# Patient Record
Sex: Female | Born: 2000 | Race: White | Hispanic: No | Marital: Single
Health system: Midwestern US, Community
[De-identification: ages and names within clinical notes are randomized; demographics above are authoritative.]

## PROBLEM LIST (undated history)

## (undated) DIAGNOSIS — T7840XA Allergy, unspecified, initial encounter: Secondary | ICD-10-CM

## (undated) DIAGNOSIS — E039 Hypothyroidism, unspecified: Secondary | ICD-10-CM

## (undated) DIAGNOSIS — F429 Obsessive-compulsive disorder, unspecified: Secondary | ICD-10-CM

## (undated) DIAGNOSIS — J45909 Unspecified asthma, uncomplicated: Secondary | ICD-10-CM

## (undated) DIAGNOSIS — T8859XA Other complications of anesthesia, initial encounter: Secondary | ICD-10-CM

## (undated) DIAGNOSIS — S83241A Other tear of medial meniscus, current injury, right knee, initial encounter: Secondary | ICD-10-CM

## (undated) DIAGNOSIS — S83529A Sprain of posterior cruciate ligament of unspecified knee, initial encounter: Secondary | ICD-10-CM

## (undated) DIAGNOSIS — K589 Irritable bowel syndrome without diarrhea: Secondary | ICD-10-CM

## (undated) DIAGNOSIS — T4145XA Adverse effect of unspecified anesthetic, initial encounter: Secondary | ICD-10-CM

## (undated) HISTORY — DX: Allergy, unspecified, initial encounter: T78.40XA

## (undated) HISTORY — DX: Unspecified asthma, uncomplicated: J45.909

## (undated) HISTORY — PX: ANTERIOR CRUCIATE LIGAMENT REPAIR: SHX115

## (undated) HISTORY — DX: Obsessive-compulsive disorder, unspecified: F42.9

---
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
## 2018-01-04 ENCOUNTER — Other Ambulatory Visit: Payer: Self-pay | Admitting: Family Medicine

## 2018-01-06 ENCOUNTER — Other Ambulatory Visit: Payer: Self-pay | Admitting: Family Medicine

## 2018-01-06 ENCOUNTER — Other Ambulatory Visit: Payer: Self-pay

## 2018-01-06 MED ORDER — NORELGESTROMIN-ETH ESTRADIOL 150-35 MCG/24HR TD PTWK: 1.0000 | MEDICATED_PATCH | TRANSDERMAL | 0 refills | Status: DC

## 2018-01-06 NOTE — Telephone Encounter (Signed)
Sent in [facility] just now/thx dmf

## 2018-06-06 ENCOUNTER — Other Ambulatory Visit: Payer: Self-pay

## 2018-06-06 ENCOUNTER — Encounter: Payer: Self-pay | Admitting: Family Medicine

## 2018-06-06 DIAGNOSIS — Z760 Encounter for issue of repeat prescription: Secondary | ICD-10-CM

## 2018-06-06 DIAGNOSIS — J45909 Unspecified asthma, uncomplicated: Secondary | ICD-10-CM

## 2018-06-06 MED ORDER — OMEPRAZOLE 20 MG PO CPDR: 20.0000 mg | DELAYED_RELEASE_CAPSULE | Freq: Every day | ORAL | 2 refills | Status: DC

## 2018-06-06 MED ORDER — ADVAIR HFA 115-21 MCG/ACT IN AERO: INHALATION_SPRAY | RESPIRATORY_TRACT | 0 refills | Status: DC

## 2018-08-22 ENCOUNTER — Telehealth: Payer: Self-pay | Admitting: Family Medicine

## 2018-08-22 ENCOUNTER — Encounter (INDEPENDENT_AMBULATORY_CARE_PROVIDER_SITE_OTHER): Payer: PRIVATE HEALTH INSURANCE | Admitting: Family Medicine

## 2018-08-22 ENCOUNTER — Encounter: Payer: Self-pay | Admitting: Family Medicine

## 2018-08-22 DIAGNOSIS — J45909 Unspecified asthma, uncomplicated: Secondary | ICD-10-CM | POA: Diagnosis not present

## 2018-08-22 MED ORDER — PROAIR HFA 108 (90 BASE) MCG/ACT IN AERS: INHALATION_SPRAY | RESPIRATORY_TRACT | 3 refills | Status: DC

## 2018-08-22 MED ORDER — LEVALBUTEROL HCL 1.25 MG/3ML IN NEBU: 1.2500 mg | INHALATION_SOLUTION | RESPIRATORY_TRACT | 99 refills | Status: DC | PRN

## 2018-08-22 MED ORDER — AZITHROMYCIN 250 MG PO TABS: ORAL_TABLET | ORAL | 0 refills | Status: DC

## 2018-08-22 NOTE — Telephone Encounter (Signed)
[facility] VISIT   Patient agreed to [facility] visit and is aware that copayment and coinsurance may apply. Patient was treated using telemedicine according to accepted telemedicine protocols.  Subjective:   Patient complains of URI symptoms.  Pt sent my chart message sayting she is having sinus problems, including a headache and sinus pressure.  She also has a bad cough and lungs are hurting, but not running a fever. She  "can't go to an urgent care due to COVID-19" and was wondering if I would be able to provide a new prescription for levabuterol for my breathing treatments and a zpack. She is doing breathing treatments every four hours but the medicine I she has have have expired. She am also taking mucinex DM and Advil. She is hoping that she'll get better without having to take the zpack, but since the weekend is coming up, I would appreciate having the prescription just in case. Thank you!  Jamie Marsh, Jamie Marsh  to Me        08/22/18 11:22 AM  Yes, please route my message to my PCP.    Social History   Tobacco Use  . Smoking status: Never Smoker  . Smokeless tobacco: Never Used  . Tobacco comment: no one smokes in the home with pt  Substance Use Topics  . Alcohol use: No    Alcohol/week: 0.0 standard drinks    Current Outpatient Medications:  .  ADVAIR HFA 115-21 MCG/ACT inhaler, USE 2 INHALATIONS BY MOUTH TWICE A DAY, Disp: 12 Inhaler, Rfl: 0 .  Cetirizine HCl (ZYRTEC PO), Take by mouth at bedtime. , Disp: , Rfl:  .  EPINEPHrine 0.3 mg/0.3 mL IJ SOAJ injection, , Disp: , Rfl:  .  ibuprofen (ADVIL,MOTRIN) 600 MG tablet, Take 1 tab PO Q6h x 1-2 days then Q6h prn pain, Disp: 30 tablet, Rfl: 0 .  levalbuterol (XOPENEX) 1.25 MG/3ML nebulizer solution, Take 1.25 mg by nebulization every 4 (four) hours as needed for wheezing., Disp: 3 mL, Rfl: PRN .  levothyroxine (SYNTHROID, LEVOTHROID) 88 MCG tablet, Take 1 tablet (88 mcg total) by mouth daily., Disp: 90 tablet, Rfl: 2 .  levothyroxine  (SYNTHROID, LEVOTHROID) 88 MCG tablet, TAKE 1 TABLET BY MOUTH EVERY DAY, Disp: 30 tablet, Rfl: 0 .  montelukast (SINGULAIR) 10 MG tablet, Take 1 tablet (10 mg total) by mouth at bedtime., Disp: 90 tablet, Rfl: 2 .  montelukast (SINGULAIR) 10 MG tablet, TAKE 1 TABLET BY MOUTH EVERYDAY AT BEDTIME, Disp: 30 tablet, Rfl: 0 .  norelgestromin-ethinyl estradiol (ORTHO EVRA) 150-35 MCG/24HR transdermal patch, Place 1 patch onto the skin once a week., Disp: 3 patch, Rfl: 0 .  omeprazole (PRILOSEC) 20 MG capsule, Take 1 capsule (20 mg total) by mouth daily., Disp: 90 capsule, Rfl: 2 .  PROAIR HFA 108 (90 Base) MCG/ACT inhaler, INHALE 2 PUFFS BY MOUTH EVERY 8 HOURS FOR WHEEZING, Disp: 1 Inhaler, Rfl: 3 .  SALINE NASAL MIST NA, Place into the nose daily. , Disp: , Rfl:  .  sertraline (ZOLOFT) 100 MG tablet, Take 1.5 tablets (150 mg total) by mouth daily., Disp: 135 tablet, Rfl: 2 .  sertraline (ZOLOFT) 100 MG tablet, TAKE 1.5 TABLETS (150 MG TOTAL) BY MOUTH DAILY., Disp: 45 tablet, Rfl: 0  Allergies  Allergen Reactions  . Augmentin [Amoxicillin-Pot Clavulanate] Nausea And Vomiting  . Cefdinir Diarrhea and Nausea And Vomiting    Assessment and Plan:   Diagnosis: 12. Please see myChart communication and orders below.   No orders of the defined types  were placed in this encounter.  No orders of the defined types were placed in this encounter.   [name], MD 08/22/2018  A total of 12 minutes were spent by me to personally review the patient-generated inquiry, review patient records and data pertinent to assessment of the patient's problem, develop a management plan including generation of prescriptions and/or orders, and on subsequent communication with the patient through secure the MyChart portal service.   There is no separately reported E/M service related to this service in the past 7 days nor does the patient have an upcoming soonest available appointment for this issue. This work was completed  in less than 7 days.   The patient consented to this service today (see patient agreement prior to ongoing communication). Patient counseled regarding the need for in-person exam for certain conditions and was advised to call the office if any changing or worsening symptoms occur.   The codes to be used for the E/M service are: []   99421 for 5-10 minutes of time spent on the inquiry. [x]   99422 for 11-20 minutes. []   [id number] for 21+ minutes.

## 2018-09-03 ENCOUNTER — Telehealth: Payer: Self-pay | Admitting: Family Medicine

## 2018-09-03 ENCOUNTER — Other Ambulatory Visit: Payer: Self-pay | Admitting: Family Medicine

## 2018-09-03 DIAGNOSIS — J45909 Unspecified asthma, uncomplicated: Secondary | ICD-10-CM

## 2018-09-03 MED ORDER — ADVAIR HFA 115-21 MCG/ACT IN AERO: INHALATION_SPRAY | RESPIRATORY_TRACT | 0 refills | Status: DC

## 2018-09-03 NOTE — Telephone Encounter (Signed)
Copied from CRM [id number]. Topic: Quick Communication - Rx Refill/Question >> Sep 03, 2018  1:32 PM Saverio Danker J wrote: Medication: ADVAIR [facility] 283-66 MCG/ACT inhaler  (Agent: If no, request that the patient contact the pharmacy for the refill.) yes (Agent: If yes, when and what did the pharmacy advise?)  Preferred Pharmacy (with phone number or street name): cb is [phone] [facility] [place] [facility] called - they are doing a transfer from [facility] and want to make sure rx I written properly.  It says qty 12 inhalers with 0 refills. Is this right?  Agent: Please be advised that RX refills may take up to 3 business days. We ask that you follow-up with your pharmacy.

## 2018-09-04 MED ORDER — ADVAIR HFA 115-21 MCG/ACT IN AERO: INHALATION_SPRAY | RESPIRATORY_TRACT | 11 refills | Status: DC

## 2018-09-04 NOTE — Telephone Encounter (Signed)
I think just one inhaler!  So strange!!

## 2018-09-04 NOTE — Addendum Note (Signed)
Addended by: Lerry Liner on: 09/04/2018 03:39 PM   Modules accepted: Orders

## 2018-09-04 NOTE — Telephone Encounter (Signed)
TA-Did you mean to write for 12 inhalers or  Like 1 with 12 refills? Plz advise/thx dmf

## 2018-10-15 ENCOUNTER — Encounter: Payer: Self-pay | Admitting: Family Medicine

## 2018-10-16 ENCOUNTER — Other Ambulatory Visit: Payer: Self-pay

## 2018-10-16 DIAGNOSIS — Z760 Encounter for issue of repeat prescription: Secondary | ICD-10-CM

## 2018-10-16 DIAGNOSIS — J45909 Unspecified asthma, uncomplicated: Secondary | ICD-10-CM

## 2018-10-16 MED ORDER — PROAIR HFA 108 (90 BASE) MCG/ACT IN AERS: INHALATION_SPRAY | RESPIRATORY_TRACT | 3 refills | Status: DC

## 2018-10-16 MED ORDER — NORELGESTROMIN-ETH ESTRADIOL 150-35 MCG/24HR TD PTWK: 1.0000 | MEDICATED_PATCH | TRANSDERMAL | 0 refills | Status: DC

## 2018-10-16 MED ORDER — MONTELUKAST SODIUM 10 MG PO TABS: 10.0000 mg | ORAL_TABLET | Freq: Every day | ORAL | 2 refills | Status: DC

## 2018-10-16 MED ORDER — LEVALBUTEROL HCL 1.25 MG/3ML IN NEBU: 1.2500 mg | INHALATION_SOLUTION | RESPIRATORY_TRACT | 99 refills | Status: DC | PRN

## 2018-10-16 MED ORDER — OMEPRAZOLE 20 MG PO CPDR: 20.0000 mg | DELAYED_RELEASE_CAPSULE | Freq: Every day | ORAL | 2 refills | Status: DC

## 2018-10-16 MED ORDER — ADVAIR HFA 115-21 MCG/ACT IN AERO: INHALATION_SPRAY | RESPIRATORY_TRACT | 3 refills | Status: DC

## 2018-10-16 MED ORDER — LEVOTHYROXINE SODIUM 88 MCG PO TABS: 88.0000 ug | ORAL_TABLET | Freq: Every day | ORAL | 0 refills | Status: DC

## 2018-10-16 MED ORDER — SERTRALINE HCL 100 MG PO TABS: 150.0000 mg | ORAL_TABLET | Freq: Every day | ORAL | 2 refills | Status: DC

## 2018-10-16 NOTE — Progress Notes (Signed)
Per TA ok to send in Rx's pt will need 90d each for travel/thx dmf

## 2018-10-21 ENCOUNTER — Other Ambulatory Visit: Payer: Self-pay | Admitting: Family Medicine

## 2018-10-21 NOTE — Progress Notes (Signed)
TA-Plz see note for pt and advise if changes need to be made/thx dmf

## 2018-10-22 ENCOUNTER — Telehealth: Payer: Self-pay | Admitting: Family Medicine

## 2018-10-22 DIAGNOSIS — Z760 Encounter for issue of repeat prescription: Secondary | ICD-10-CM

## 2018-10-22 DIAGNOSIS — J45909 Unspecified asthma, uncomplicated: Secondary | ICD-10-CM

## 2018-10-22 MED ORDER — LEVOTHYROXINE SODIUM 88 MCG PO TABS: 88.0000 ug | ORAL_TABLET | Freq: Every day | ORAL | 0 refills | Status: DC

## 2018-10-22 MED ORDER — OMEPRAZOLE 20 MG PO CPDR: 20.0000 mg | DELAYED_RELEASE_CAPSULE | Freq: Every day | ORAL | 2 refills | Status: DC

## 2018-10-22 MED ORDER — MONTELUKAST SODIUM 10 MG PO TABS: 10.0000 mg | ORAL_TABLET | Freq: Every day | ORAL | 2 refills | Status: DC

## 2018-10-22 MED ORDER — PROAIR HFA 108 (90 BASE) MCG/ACT IN AERS: INHALATION_SPRAY | RESPIRATORY_TRACT | 3 refills | Status: DC

## 2018-10-22 MED ORDER — ADVAIR HFA 115-21 MCG/ACT IN AERO: INHALATION_SPRAY | RESPIRATORY_TRACT | 3 refills | Status: DC

## 2018-10-22 MED ORDER — SERTRALINE HCL 100 MG PO TABS: 150.0000 mg | ORAL_TABLET | Freq: Every day | ORAL | 2 refills | Status: DC

## 2018-10-22 MED ORDER — NORELGESTROMIN-ETH ESTRADIOL 150-35 MCG/24HR TD PTWK: 1.0000 | MEDICATED_PATCH | TRANSDERMAL | 0 refills | Status: DC

## 2018-10-22 MED ORDER — LEVALBUTEROL HCL 1.25 MG/3ML IN NEBU: 1.2500 mg | INHALATION_SOLUTION | RESPIRATORY_TRACT | 99 refills | Status: DC | PRN

## 2018-10-22 NOTE — Telephone Encounter (Signed)
Rx's resent.  

## 2018-10-22 NOTE — Telephone Encounter (Signed)
Copied from CRM [id number]. Topic: Quick Communication - Rx Refill/Question >> Oct 22, 2018  1:49 PM Maia Petties wrote: Medication: pt states [facility] will not call for transfer & [facility] will not transfer RX. Pt is going to [place] for the summer and needs 3 month supply. Pt leaving on Tuesday 10/28/2018 in the morning. Pt needing resent please.              ADVAIR HFA 115-21 MCG/ACT inhaler    levalbuterol (XOPENEX) 1.25 MG/3ML nebulizer solution   levothyroxine (SYNTHROID) 88 MCG tablet   montelukast (SINGULAIR) 10 MG tablet   norelgestromin-ethinyl estradiol (ORTHO EVRA) 150-35 MCG/24HR transdermal patch   omeprazole (PRILOSEC) 20 MG capsule   PROAIR HFA 108 (90 Base) MCG/ACT inhaler   sertraline (ZOLOFT) 100 MG tablet   Has the patient contacted their pharmacy? Yes both refuse to transfer Preferred Pharmacy (with phone number or street name): [facility] [place], [place] - [address] [phone] (Phone) [phone] (Fax)

## 2018-10-22 NOTE — Telephone Encounter (Signed)
Jamie Marsh called to f/u on the attached form required by [employer] for her to bring her emotional support animal (dog) with her. Pt needs to submit this ASAP. Please see attachment.

## 2019-01-15 ENCOUNTER — Telehealth: Payer: Self-pay

## 2019-01-15 NOTE — Telephone Encounter (Signed)
Copied from [place] [id number]. Topic: General - Other >> Jan 15, 2019  3:25 PM Celene Kras A wrote: Reason for CRM: Pt is in [place] and is requesting to speak with PCP regarding medications. Please advise.

## 2019-01-16 ENCOUNTER — Other Ambulatory Visit: Payer: Self-pay

## 2019-01-16 DIAGNOSIS — J45909 Unspecified asthma, uncomplicated: Secondary | ICD-10-CM

## 2019-01-16 DIAGNOSIS — Z760 Encounter for issue of repeat prescription: Secondary | ICD-10-CM

## 2019-01-16 MED ORDER — SERTRALINE HCL 100 MG PO TABS: 150.0000 mg | ORAL_TABLET | Freq: Every day | ORAL | 2 refills | Status: DC

## 2019-01-16 MED ORDER — LEVALBUTEROL HCL 1.25 MG/3ML IN NEBU: 1.2500 mg | INHALATION_SOLUTION | RESPIRATORY_TRACT | 99 refills | Status: DC | PRN

## 2019-01-16 MED ORDER — PROAIR HFA 108 (90 BASE) MCG/ACT IN AERS: INHALATION_SPRAY | RESPIRATORY_TRACT | 2 refills | Status: DC

## 2019-01-16 MED ORDER — NORELGESTROMIN-ETH ESTRADIOL 150-35 MCG/24HR TD PTWK: 1.0000 | MEDICATED_PATCH | TRANSDERMAL | 2 refills | Status: DC

## 2019-01-16 MED ORDER — MONTELUKAST SODIUM 10 MG PO TABS: 10.0000 mg | ORAL_TABLET | Freq: Every day | ORAL | 2 refills | Status: DC

## 2019-01-16 MED ORDER — OMEPRAZOLE 20 MG PO CPDR: 20.0000 mg | DELAYED_RELEASE_CAPSULE | Freq: Every day | ORAL | 2 refills | Status: DC

## 2019-01-16 MED ORDER — LEVOTHYROXINE SODIUM 88 MCG PO TABS: 88.0000 ug | ORAL_TABLET | Freq: Every day | ORAL | 2 refills | Status: DC

## 2019-01-16 NOTE — Telephone Encounter (Signed)
I sent pt a message via MyChart/I offered V V and if she wanted to respond with any questions she has that we would try to help her/thx dmf

## 2019-05-31 NOTE — Assessment & Plan Note (Addendum)
History: Current symptoms: Fatigue. Patient denies other symptoms of hypo or hypothyroidism. Lab Results  Component Value Date   TSH 3.74 04/17/2017   TSH 7.16 (H) 12/20/2016   TSH 8.16 (H) 11/30/2016   Lab Results  Component Value Date   FREET4 0.81 04/17/2017   FREET4 0.89 12/20/2016   FREET4 0.86 07/05/2016    Assessment/Plan: Continue synthroid 88 mcg daily. Recheck thyroid labs today. Instructed not to take MVM or iron within 4 hours of taking thyroid medications.  Common Reasons for Abnormal TSH Levels on a Previously Stable Dosage of Thyroid Hormone . Patient nonadherent to thyroid hormone regimen (missing doses) . Decreased absorption of thyroid hormone . Patient is now taking thyroid hormone with food . Patient takes thyroid hormone within four hours of calcium, iron, soy products, or aluminum-containing antacids . Patient is prescribed medication that decreases absorption of thyroid hormone, such as cholestyramine (Questran), colestipol (Colestid), orlistat (Xenical), or sucralfate (Carafate) . Patient is now pregnant or recently started or stopped estrogen-containing oral contraceptive or hormone therapy . Generic substitution for brand name or vice versa, or substitution of one generic formulation for another . Patient started on sertraline (Zoloft), another selective serotonin reuptake inhibitor, or a tricyclic antidepressant . Patient started on carbamazepine (Tegretol) or phenytoin (Dilantin)

## 2019-05-31 NOTE — Assessment & Plan Note (Addendum)
With anxiety. Assessment: Current symptoms:None, No current suicidal and homicidal ideation. Side effects from treatment: none.  Plan: 1. Medications: Zoloft. Discussed potential risks, expected benefits, possible side effects of the medicine. We also discussed how to take it correctly and dosing instructions.  2. Labs: see orders. 3. Counseling  4. If she has any significant side effects to the medicine, she is to stop it and call for advice. Instructed patient to contact office or on-call physician promptly should condition worsen or any new symptoms appear.

## 2019-05-31 NOTE — Progress Notes (Signed)
Virtual Visit via Video   Due to the COVID-19 pandemic, this visit was completed with telemedicine (audio/video) technology to reduce patient and provider exposure as well as to preserve personal protective equipment.   I connected with Jamie Marsh by a video enabled telemedicine application and verified that I am speaking with the correct person using two identifiers. Location patient: Home Location provider: [facility], Office Persons participating in the virtual visit: Alphonsa Overall, MD   I discussed the limitations of evaluation and management by telemedicine and the availability of in person appointments. The patient expressed understanding and agreed to proceed.  Care Team   Patient Care Team: Dianne Dun, MD as PCP - General (Family Medicine)  Subjective:   HPI:  CPX and follow up of chronic medical conditions.  Doing well- was in [place] with her boyfriend all friend.  Using XULANE patch for contraception.   Depression screen [facility] 2/9 06/01/2019 04/17/2017  Decreased Interest 0 0  Down, Depressed, Hopeless 0 0  PHQ - 2 Score 0 0    Review of Systems  Constitutional: Positive for malaise/fatigue. Negative for fever.  HENT: Negative for congestion and hearing loss.   Eyes: Negative for blurred vision, discharge and redness.  Respiratory: Negative for cough and shortness of breath.   Cardiovascular: Negative for chest pain, palpitations and leg swelling.  Gastrointestinal: Negative for abdominal pain and heartburn.  Genitourinary: Negative for dysuria.  Musculoskeletal: Negative for falls.  Skin: Negative for rash.  Neurological: Negative for loss of consciousness and headaches.  Endo/Heme/Allergies: Does not bruise/bleed easily.  Psychiatric/Behavioral: Negative.  Negative for depression, hallucinations, memory loss, substance abuse and suicidal ideas. The patient is not nervous/anxious and does not have insomnia.   All other systems reviewed and are  negative.    Patient Active Problem List   Diagnosis Date Noted  . Well woman exam without gynecological exam 12/10/2017  . Anterolisthesis, grade 1, L5-S1 01/23/2016  . IBS (irritable bowel syndrome) 07/05/2014  . Dysmenorrhea 03/16/2013  . OCD (obsessive compulsive disorder)   . Allergy   . Asthma   . Hypothyroidism 11/06/2010    Social History   Tobacco Use  . Smoking status: Never Smoker  . Smokeless tobacco: Never Used  . Tobacco comment: no one smokes in the home with pt  Substance Use Topics  . Alcohol use: No    Alcohol/week: 0.0 standard drinks    Current Outpatient Medications:  .  ADVAIR HFA 115-21 MCG/ACT inhaler, USE 2 INHALATIONS BY MOUTH TWICE A DAY, Disp: 3 Inhaler, Rfl: 3 .  Cetirizine HCl (ZYRTEC PO), Take by mouth at bedtime. , Disp: , Rfl:  .  levalbuterol (XOPENEX) 1.25 MG/3ML nebulizer solution, Take 1.25 mg by nebulization every 4 (four) hours as needed for wheezing., Disp: 3 mL, Rfl: PRN .  levothyroxine (SYNTHROID) 88 MCG tablet, Take 1 tablet (88 mcg total) by mouth daily., Disp: 90 tablet, Rfl: 2 .  montelukast (SINGULAIR) 10 MG tablet, Take 1 tablet (10 mg total) by mouth at bedtime., Disp: 90 tablet, Rfl: 2 .  omeprazole (PRILOSEC) 20 MG capsule, Take 1 capsule (20 mg total) by mouth daily., Disp: 90 capsule, Rfl: 2 .  PROAIR HFA 108 (90 Base) MCG/ACT inhaler, INHALE 2 PUFFS BY MOUTH EVERY 8 HOURS FOR WHEEZING, Disp: 18 g, Rfl: 2 .  SALINE NASAL MIST NA, Place into the nose daily. , Disp: , Rfl:  .  sertraline (ZOLOFT) 100 MG tablet, Take 1.5 tablets (150 mg  total) by mouth daily., Disp: 135 tablet, Rfl: 2 .  XULANE 150-35 MCG/24HR transdermal patch, APPLY 1 PATCH TOPICALLY ONCE A WEEK, Disp: 3 patch, Rfl: 0  Allergies  Allergen Reactions  . Augmentin [Amoxicillin-Pot Clavulanate] Nausea And Vomiting  . Cefdinir Diarrhea and Nausea And Vomiting    Objective:  Ht 5\' 5"  (1.651 m)   LMP 05/19/2019   BMI 23.53 kg/m   VITALS: Per patient if  applicable, see vitals. GENERAL: Alert, appears well and in no acute distress. HEENT: Atraumatic, conjunctiva clear, no obvious abnormalities on inspection of external nose and ears. NECK: Normal movements of the head and neck. CARDIOPULMONARY: No increased WOB. Speaking in clear sentences. I:E ratio WNL.  MS: Moves all visible extremities without noticeable abnormality. PSYCH: Pleasant and cooperative, well-groomed. Speech normal rate and rhythm. Affect is appropriate. Insight and judgement are appropriate. Attention is focused, linear, and appropriate.  NEURO: CN grossly intact. Oriented as arrived to appointment on time with no prompting. Moves both UE equally.  SKIN: No obvious lesions, wounds, erythema, or cyanosis noted on face or hands.  Depression screen [facility] 2/9 06/01/2019 04/17/2017  Decreased Interest 0 0  Down, Depressed, Hopeless 0 0  PHQ - 2 Score 0 0     . COVID-19 Education: The signs and symptoms of COVID-19 were discussed with the patient and how to seek care for testing if needed. The importance of social distancing was discussed today. . Reviewed expectations re: course of current medical issues. . Discussed self-management of symptoms. . Outlined signs and symptoms indicating need for more acute intervention. . Patient verbalized understanding and all questions were answered. 04/19/2017 Health Maintenance issues including appropriate healthy diet, exercise, and smoking avoidance were discussed with patient. . See orders for this visit as documented in the electronic medical record.  Marland Kitchen, MD    Lab Results  Component Value Date   WBC 7.5 07/05/2016   HGB 12.5 07/05/2016   HCT 36.9 07/05/2016   PLT 283.0 07/05/2016   GLUCOSE 79 04/29/2016   NA 143 04/29/2016   K 3.4 (L) 04/29/2016   CL 103 04/29/2016   CREATININE 0.60 04/29/2016   BUN 8 04/29/2016   TSH 3.74 04/17/2017    Lab Results  Component Value Date   TSH 3.74 04/17/2017   Lab Results  Component  Value Date   WBC 7.5 07/05/2016   HGB 12.5 07/05/2016   HCT 36.9 07/05/2016   MCV 86.1 07/05/2016   PLT 283.0 07/05/2016   Lab Results  Component Value Date   NA 143 04/29/2016   K 3.4 (L) 04/29/2016   GLUCOSE 79 04/29/2016   BUN 8 04/29/2016   CREATININE 0.60 04/29/2016   No results found for: CHOL No results found for: HDL No results found for: LDLCALC No results found for: TRIG No results found for: CHOLHDL No results found for: 14/07/2015     Assessment & Plan:   Problem List Items Addressed This Visit      Active Problems   Hypothyroidism    History: Current symptoms: none. Patient denies change in energy level, diarrhea, heat / cold intolerance, nervousness, palpitations and weight changes. Symptoms have been well-controlled. Lab Results  Component Value Date   TSH 3.74 04/17/2017   TSH 7.16 (H) 12/20/2016   TSH 8.16 (H) 11/30/2016   Lab Results  Component Value Date   FREET4 0.81 04/17/2017   FREET4 0.89 12/20/2016   FREET4 0.86 07/05/2016    Assessment/Plan: Continue synthroid 88 mcg daily. Recheck  thyroid labs today. Instructed not to take MVM or iron within 4 hours of taking thyroid medications.  Common Reasons for Abnormal TSH Levels on a Previously Stable Dosage of Thyroid Hormone . Patient nonadherent to thyroid hormone regimen (missing doses) . Decreased absorption of thyroid hormone . Patient is now taking thyroid hormone with food . Patient takes thyroid hormone within four hours of calcium, iron, soy products, or aluminum-containing antacids . Patient is prescribed medication that decreases absorption of thyroid hormone, such as cholestyramine (Questran), colestipol (Colestid), orlistat (Xenical), or sucralfate (Carafate) . Patient is now pregnant or recently started or stopped estrogen-containing oral contraceptive or hormone therapy . Generic substitution for brand name or vice versa, or substitution of one generic formulation for another . Patient  started on sertraline (Zoloft), another selective serotonin reuptake inhibitor, or a tricyclic antidepressant . Patient started on carbamazepine (Tegretol) or phenytoin (Dilantin)          Relevant Orders   Well woman- non DM- CBC   Well woman- non DM- CMET   OCD (obsessive compulsive disorder)    With anxiety. Assessment: Current symptoms:None, No current suicidal and homicidal ideation. Side effects from treatment: none.  Plan: 1. Medications: Zoloft. Discussed potential risks, expected benefits, possible side effects of the medicine. We also discussed how to take it correctly and dosing instructions.  2. Labs: see orders. 3. Counseling  4. If she has any significant side effects to the medicine, she is to stop it and call for advice. Instructed patient to contact office or on-call physician promptly should condition worsen or any new symptoms appear.        Relevant Orders   well woman- non DM- TSH   T4, free   Asthma    Has been well controlled.      Well woman exam without gynecological exam - Primary    Reviewed preventive care protocols, scheduled due services, and updated immunizations Discussed nutrition, exercise, diet, and healthy lifestyle.        Other Visit Diagnoses    Long term (current) use of hormonal contraceptives       Relevant Orders   Well woman- non DM- CBC   Well woman- non DM- lipid      I have discontinued Raeanne Barry. Perris's EPINEPHrine, ibuprofen, azithromycin, and norelgestromin-ethinyl estradiol. I am also having her maintain her Cetirizine HCl (ZYRTEC PO), SALINE NASAL MIST NA, Xulane, Advair HFA, levalbuterol, levothyroxine, montelukast, omeprazole, ProAir HFA, and sertraline.  No orders of the defined types were placed in this encounter.    Arnette Norris, MD

## 2019-05-31 NOTE — Assessment & Plan Note (Signed)
Reviewed preventive care protocols, scheduled due services, and updated immunizations Discussed nutrition, exercise, diet, and healthy lifestyle.  

## 2019-05-31 NOTE — Assessment & Plan Note (Addendum)
Has been well controlled. 

## 2019-06-01 ENCOUNTER — Telehealth (INDEPENDENT_AMBULATORY_CARE_PROVIDER_SITE_OTHER): Payer: PRIVATE HEALTH INSURANCE | Admitting: Family Medicine

## 2019-06-01 ENCOUNTER — Encounter: Payer: Self-pay | Admitting: Family Medicine

## 2019-06-01 VITALS — Ht 65.0 in

## 2019-06-01 DIAGNOSIS — Z793 Long term (current) use of hormonal contraceptives: Secondary | ICD-10-CM

## 2019-06-01 DIAGNOSIS — J45909 Unspecified asthma, uncomplicated: Secondary | ICD-10-CM

## 2019-06-01 DIAGNOSIS — R5383 Other fatigue: Secondary | ICD-10-CM

## 2019-06-01 DIAGNOSIS — Z0000 Encounter for general adult medical examination without abnormal findings: Secondary | ICD-10-CM

## 2019-06-01 DIAGNOSIS — F429 Obsessive-compulsive disorder, unspecified: Secondary | ICD-10-CM

## 2019-06-01 DIAGNOSIS — E031 Congenital hypothyroidism without goiter: Secondary | ICD-10-CM

## 2019-06-01 DIAGNOSIS — T7840XD Allergy, unspecified, subsequent encounter: Secondary | ICD-10-CM

## 2019-06-01 MED ORDER — SERTRALINE HCL 100 MG PO TABS: 150.0000 mg | ORAL_TABLET | Freq: Every day | ORAL | 3 refills | Status: DC

## 2019-06-01 MED ORDER — ADVAIR HFA 115-21 MCG/ACT IN AERO: INHALATION_SPRAY | RESPIRATORY_TRACT | 3 refills | Status: DC

## 2019-06-01 MED ORDER — MONTELUKAST SODIUM 10 MG PO TABS: 10.0000 mg | ORAL_TABLET | Freq: Every day | ORAL | 2 refills | Status: DC

## 2019-06-01 MED ORDER — PROAIR HFA 108 (90 BASE) MCG/ACT IN AERS: INHALATION_SPRAY | RESPIRATORY_TRACT | 2 refills | Status: DC

## 2019-06-01 MED ORDER — LEVALBUTEROL HCL 1.25 MG/3ML IN NEBU: 1.2500 mg | INHALATION_SOLUTION | RESPIRATORY_TRACT | 99 refills | Status: AC | PRN

## 2019-06-01 MED ORDER — LEVOTHYROXINE SODIUM 88 MCG PO TABS: 88.0000 ug | ORAL_TABLET | Freq: Every day | ORAL | 2 refills | Status: DC

## 2019-06-01 MED ORDER — XULANE 150-35 MCG/24HR TD PTWK: MEDICATED_PATCH | TRANSDERMAL | 0 refills | Status: DC

## 2019-06-01 NOTE — Telephone Encounter (Signed)
No that's fine.  I do not mind sending this letter to her via mychart.  Thank you.

## 2019-06-01 NOTE — Assessment & Plan Note (Addendum)
Likely multifactorial.  Did have covid several months ago and has remained fatigued.  Will check additional labs today, along with her thyroid screening, etc.  The patient indicates understanding of these issues and agrees with the plan.

## 2019-06-01 NOTE — Assessment & Plan Note (Addendum)
Deteriorated.  Will restart allergy rx- eRx refills sent.

## 2019-06-02 LAB — LIPID PANEL
Cholesterol: 164 (ref 0–200)
HDL: 49 (ref 35–70)
LDL Cholesterol: 104
Triglycerides: 124 (ref 40–160)

## 2019-06-02 LAB — BASIC METABOLIC PANEL
BUN: 6 (ref 4–21)
CO2: 27 — AB (ref 13–22)
Chloride: 104 (ref 99–108)
Creatinine: 0.7 (ref 0.5–1.1)
Glucose: 83
Potassium: 4 (ref 3.4–5.3)
Sodium: 141 (ref 137–147)

## 2019-06-02 LAB — IRON,TIBC AND FERRITIN PANEL
Ferritin: 16.8
Iron: 63

## 2019-06-02 LAB — CBC AND DIFFERENTIAL
HCT: 38 (ref 36–46)
Hemoglobin: 12.4 (ref 12.0–16.0)
Platelets: 235 (ref 150–399)
WBC: 7

## 2019-06-02 LAB — HEPATIC FUNCTION PANEL
ALT: 9 (ref 7–35)
AST: 13 (ref 13–35)

## 2019-06-02 LAB — COMPREHENSIVE METABOLIC PANEL
Calcium: 8.6 — AB (ref 8.7–10.7)
Globulin: 2.6

## 2019-06-02 LAB — VITAMIN D 25 HYDROXY (VIT D DEFICIENCY, FRACTURES): Vit D, 25-Hydroxy: 43.2

## 2019-06-02 LAB — CBC: RBC: 4.39 (ref 3.87–5.11)

## 2019-06-02 LAB — TSH: TSH: 5.08 (ref 0.41–5.90)

## 2019-07-30 ENCOUNTER — Other Ambulatory Visit: Payer: Self-pay

## 2019-07-30 ENCOUNTER — Telehealth: Payer: Self-pay | Admitting: General Practice

## 2019-07-30 DIAGNOSIS — J45909 Unspecified asthma, uncomplicated: Secondary | ICD-10-CM

## 2019-07-30 DIAGNOSIS — Z760 Encounter for issue of repeat prescription: Secondary | ICD-10-CM

## 2019-07-30 MED ORDER — LEVOTHYROXINE SODIUM 88 MCG PO TABS: 88.0000 ug | ORAL_TABLET | Freq: Every day | ORAL | 2 refills | Status: AC

## 2019-07-30 MED ORDER — PROAIR HFA 108 (90 BASE) MCG/ACT IN AERS: INHALATION_SPRAY | RESPIRATORY_TRACT | 2 refills | Status: AC

## 2019-07-30 MED ORDER — ADVAIR HFA 115-21 MCG/ACT IN AERO: INHALATION_SPRAY | RESPIRATORY_TRACT | 0 refills | Status: AC

## 2019-07-30 MED ORDER — OMEPRAZOLE 20 MG PO CPDR: 20.0000 mg | DELAYED_RELEASE_CAPSULE | Freq: Every day | ORAL | 2 refills | Status: AC

## 2019-07-30 MED ORDER — CETIRIZINE HCL 10 MG PO TABS: 10.0000 mg | ORAL_TABLET | Freq: Every day | ORAL | 2 refills | Status: AC

## 2019-07-30 MED ORDER — XULANE 150-35 MCG/24HR TD PTWK: MEDICATED_PATCH | TRANSDERMAL | 0 refills | Status: AC

## 2019-07-30 MED ORDER — MONTELUKAST SODIUM 10 MG PO TABS: 10.0000 mg | ORAL_TABLET | Freq: Every day | ORAL | 2 refills | Status: AC

## 2019-07-30 NOTE — Telephone Encounter (Signed)
Patient mother is requesting a refill for levothroxine, xulane, omeprazole, montelukast, cetirizine, advair, flonase, and pro air sent to [facility] [address] [place] in [place]. Informed pt mother pt needs to select another provider to [facility] due to Dr. Dayton Martes leaving the practice. CB is [phone]

## 2019-07-30 NOTE — Telephone Encounter (Signed)
Okay to send 2 months of med into the pharmacy for patient.

## 2019-07-30 NOTE — Telephone Encounter (Signed)
Medications sent in sent message through Mychart t inform patient

## 2019-07-31 ENCOUNTER — Other Ambulatory Visit: Payer: Self-pay

## 2019-07-31 MED ORDER — SERTRALINE HCL 100 MG PO TABS: 150.0000 mg | ORAL_TABLET | Freq: Every day | ORAL | 0 refills | Status: AC

## 2019-07-31 NOTE — Telephone Encounter (Signed)
Patient called back and is requesting a refill for Sertraline sent to [facility] In [place]. CB is [phone]

## 2019-07-31 NOTE — Telephone Encounter (Signed)
Patient calling states that she needs a refill on pending medication. [place] is [place] the [place] at this time other medications were refilled yesterday patient aware that Dr. Dayton Martes no longer here and she will need to choose a primary doctor. Please Advise.

## 2020-04-04 ENCOUNTER — Encounter: Payer: PRIVATE HEALTH INSURANCE | Attending: Medical

## 2020-04-11 ENCOUNTER — Ambulatory Visit: Attending: Medical

## 2020-04-11 ENCOUNTER — Ambulatory Visit: Admit: 2020-04-11 | Discharge: 2020-04-11 | Payer: PRIVATE HEALTH INSURANCE | Attending: Medical

## 2020-04-11 DIAGNOSIS — J3489 Other specified disorders of nose and nasal sinuses: Secondary | ICD-10-CM

## 2020-04-11 MED ORDER — FLUTICASONE 50 MCG/ACTUATION NASAL SPRAY, SUSP
50 mcg/actuation | Freq: Every day | NASAL | 3 refills | Status: AC
Start: 2020-04-11 — End: 2020-05-11

## 2020-04-11 MED ORDER — AZELASTINE 137 MCG NASAL SPRAY AEROSOL
137 mcg (0.1 %) | Freq: Two times a day (BID) | NASAL | 3 refills | Status: AC
Start: 2020-04-11 — End: 2020-05-11

## 2020-04-11 NOTE — Progress Notes (Signed)
HPI:  Jacqueline Mccoy is a 19 y.o. female seen for a consultation from Narda Bonds., MD for Sinus Pain (Patient states that she has chronic sinsitis and that she gets a sinus infection once a month.).   Patient presents to the clinic for evaluation of her sinuses. She states that this has pressure and pain in both of her cheeks. She reports that she has been treated 4-5 times this year for her sinuses. Patient reports that she lived in [place] last year and was likely treated for two sines infections. She reports that since she moved here, she has experienced more problems with her sinuses. She states that she feels like she has pressure in her sinuses even after she completes antibiotics. Patient reports that she is currently on ceftin for her sinuses. She is also on immunotherapy and has been for one month. She states that she is allergic to pollen, grass, mold and ragweed. Patient reports that she has tried Mucinex, Zyrtec, Singulair, Claritin and Flonase which can help a little with her symptoms.   Of note, patient reports that she had a CT scan of her sinuses 6 months ago in [place].     Past Medical History, Past Surgical History, Family history, Social History, and Medications were all reviewed with the patient today and updated as necessary.     Allergies   Allergen Reactions   ??? Amoxicillin-Pot Clavulanate Diarrhea, Other (comments) and Nausea and Vomiting   ??? Cefdinir Diarrhea, Other (comments) and Nausea and Vomiting     There is no problem list on file for this patient.    Current Outpatient Medications   Medication Sig   ??? albuterol (ProAir HFA) 90 mcg/actuation inhaler 1 puff as needed   ??? cetirizine (ZYRTEC) 10 mg tablet Take 10 mg by mouth daily.   ??? diclofenac (VOLTAREN) 1 % gel Apply 1 g to affected area four (4) times daily.   ??? fluticasone propion-salmeteroL (Advair HFA) 115-21 mcg/actuation inhaler 2 puffs   ??? levalbuterol (XOPENEX) 1.25 mg/3 mL nebu Take 1.25 mg by inhalation every four (4) hours  as needed.   ??? levothyroxine (SYNTHROID) 88 mcg tablet 1 tablet in the morning on an empty stomach   ??? loratadine (CLARITIN) 10 mg tablet Take 10 mg by mouth daily.   ??? montelukast (SINGULAIR) 10 mg tablet 1 tablet   ??? omeprazole (PRILOSEC) 20 mg capsule 1 capsule 30 minutes before morning meal   ??? azelastine (ASTELIN) 137 mcg (0.1 %) nasal spray 1 Spray by Both Nostrils route two (2) times a day for 30 days. Use in each nostril as directed   ??? fluticasone propionate (FLONASE) 50 mcg/actuation nasal spray 2 Sprays by Both Nostrils route daily for 30 days.   ??? albuterol (ACCUNEB) 1.25 mg/3 mL nebu 3 ml as needed   ??? fluticasone propionate (Flonase Allergy Relief) 50 mcg/actuation nasal spray 1 spray in each nostril   ??? hydrOXYzine HCL (ATARAX) 25 mg tablet 1 tablet as needed   ??? sertraline (ZOLOFT) 50 mg tablet Take 150 mg by mouth daily.     No current facility-administered medications for this visit.     History reviewed. No pertinent past medical history.  Social History     Tobacco Use   ??? Smoking status: Never Smoker   ??? Smokeless tobacco: Never Used   Substance Use Topics   ??? Alcohol use: Yes     History reviewed. No pertinent surgical history.  History reviewed. No pertinent family history.  ROS:    Review of Systems   Constitutional: Negative for chills and fever.   HENT: Positive for sinus pain. Negative for ear pain.    Eyes: Negative for blurred vision and double vision.   Respiratory: Negative for cough.    Cardiovascular: Negative for chest pain.   Gastrointestinal: Negative for nausea and vomiting.   Musculoskeletal: Negative for neck pain.   Skin: Negative for rash.   Neurological: Negative for dizziness and headaches.   Endo/Heme/Allergies: Negative for environmental allergies.          PHYSICAL EXAM:    Visit Vitals  Resp 18   Ht 5\' 5"  (1.651 m)   Wt 155 lb (70.3 kg)   BMI 25.79 kg/m??       Head  Head and Face - The head and face are atraumatic, normocephalic.  The salivary glands are intact and  the facial appearance is symmetric.    Head shape - No scars, lesions, or masses    Ear  Ear - Tympanic membranes are clear, the external auditory canal is without discharge and the tympanic membranes are mobile.  There is no tympanic membrane erythema and no middle ear opacity is visualized.    Pinna: bilateral - No hematomas or lacerations    Eye  Eyeball - bilateral - extraocular motions intact, equal in size and movement    Nose and Sinuses  Nose - mucosa is pink and the septum is midline.  There are no nasal lesions and there was moderate turbinate hypertrophy.    Mouth and Throat  Lips - upper lip - normal: no dryness, cracking, pallor, cyanosis, or vesicular eruption.  Lower lip: normal: no dryness, cracking, pallor, cyanosis, or vesicular eruption.     Teeth and Gums - No bleeding, no inflammation or ulceration.    Lips - Pink and symmetrical  Oral Cavity - Oral mucosa pink, soft and hard palates contiguous and tongue moist without ulcers.  The mucosa is without ulcerations. No oral cavity masses present.   Parotid Gland - Bilateral - Non tender, not swollen.  Oropharynx - No discharge or Erythema  Nasopharynx - Non obstructed, mucosa pink and moist.    Hypopharynx - No erythema  Submandibular Gland - Non tender, not swollen.    Tonsils - Normal    Neck   Neck - Full range of motion and Supple.  Non Tender.   No Masses.    Trachea - Midline.  Thyroid - Gland - Symmetric.  Non Tender.  Nodules - No nodules.    Neurologic - II - XII Grossly intact bilaterally    Cardiac  Inspection - Jugular Vein:  Bilateral - non distended, no prominent pulsations    Chest and Lung  Inspection - Movements:  Chest symmetrical with bilateral expansion, respirations even and non labored    Nasal Endoscopy Procedure Note      Indications   A nasal endoscopy will be performed due to recurrent sinusitis and nasal obstruction.    Procedure Details   The risks, benefits, complications, treatment options, and expected outcomes were  discussed with the patient. The patient concurred with the proposed plan, giving informed consent. The interior nasal cavity, middle and superior meatus, turbinates and sphenoethmoidal recess were evaluated.    Nasal endoscopy was performed today under Rhinocaine was applied topically.    Findings  The findings include: the septum was midline, the turbinates were hypertrophic and there were no polyps, masses, or mucopurulence identified.  Patient Response   The patient tolerated the procedure well..            Procedure Impression  Turbinate hypertrophy      ASSESSMENT and PLAN      ICD-10-CM ICD-9-CM    1. Sinus pain  J34.89 478.19 NASAL ENDOSCOPY,DX   2. Sinus pressure  J34.89 478.19 NASAL ENDOSCOPY,DX   3. Nasal congestion  R09.81 478.19 NASAL ENDOSCOPY,DX   4. Nasal turbinate hypertrophy  J34.3 478.0 NASAL ENDOSCOPY,DX       Patient will continue her antihistamine and Flonase. I recommended adding nasal saline irrigations and Astelin. Patient pulled up the report of the CT scan on her phone and per the report there was minimal thickening in the maxillary sinuses. Patient's recurrent symptoms are likely related to her underlying allergies. She will return to the clinic in 4 weeks to follow up with attending and if there is no improvement, discuss if she is a candidate for inferior turbinate reduction.     Thank you for the opportunity to participate in the care of this patient. Please let me know if you have any further questions or concerns.    Ahmed Prima, PA-C  04/11/2020

## 2021-04-02 ENCOUNTER — Other Ambulatory Visit: Payer: Self-pay | Admitting: Family Medicine
